# Patient Record
Sex: Female | Born: 1975 | Race: White | Hispanic: No | Marital: Married
Health system: Southern US, Community
[De-identification: ages and names within clinical notes are randomized; demographics above are authoritative.]

---

## 2020-04-26 ENCOUNTER — Encounter (HOSPITAL_COMMUNITY): Payer: Self-pay

## 2020-04-26 ENCOUNTER — Emergency Department (HOSPITAL_COMMUNITY): Payer: No Typology Code available for payment source

## 2020-04-26 ENCOUNTER — Emergency Department (HOSPITAL_COMMUNITY)
Admission: EM | Admit: 2020-04-26 | Discharge: 2020-04-26 | Disposition: A | Discharge status: Home / Self Care | Payer: No Typology Code available for payment source | Attending: Emergency Medicine | Admitting: Emergency Medicine

## 2020-04-26 DIAGNOSIS — R109 Unspecified abdominal pain: Secondary | ICD-10-CM | POA: Diagnosis present

## 2020-04-26 DIAGNOSIS — N23 Unspecified renal colic: Secondary | ICD-10-CM | POA: Diagnosis not present

## 2020-04-26 LAB — CBC
HCT: 38.6 % (ref 36.0–46.0)
Hemoglobin: 12.8 g/dL (ref 12.0–15.0)
MCH: 32.6 pg (ref 26.0–34.0)
MCHC: 33.2 g/dL (ref 30.0–36.0)
MCV: 98.2 fL (ref 80.0–100.0)
Platelets: 114 10*3/uL — ABNORMAL LOW (ref 150–400)
RBC: 3.93 MIL/uL (ref 3.87–5.11)
RDW: 13.2 % (ref 11.5–15.5)
WBC: 7.9 10*3/uL (ref 4.0–10.5)
nRBC: 0 % (ref 0.0–0.2)

## 2020-04-26 LAB — URINALYSIS, ROUTINE W REFLEX MICROSCOPIC
Bilirubin Urine: NEGATIVE
Glucose, UA: NEGATIVE mg/dL
Ketones, ur: NEGATIVE mg/dL
Leukocytes,Ua: NEGATIVE
Nitrite: NEGATIVE
Protein, ur: 30 mg/dL — AB
Specific Gravity, Urine: 1.02 (ref 1.005–1.030)
pH: 6 (ref 5.0–8.0)

## 2020-04-26 LAB — BASIC METABOLIC PANEL
Anion gap: 9 (ref 5–15)
BUN: 17 mg/dL (ref 6–20)
CO2: 22 mmol/L (ref 22–32)
Calcium: 8.5 mg/dL — ABNORMAL LOW (ref 8.9–10.3)
Chloride: 114 mmol/L — ABNORMAL HIGH (ref 98–111)
Creatinine, Ser: 0.99 mg/dL (ref 0.44–1.00)
GFR, Estimated: >60 mL/min (ref >60)
Glucose, Bld: 151 mg/dL — ABNORMAL HIGH (ref 70–99)
Potassium: 4.3 mmol/L (ref 3.5–5.1)
Sodium: 145 mmol/L (ref 135–145)

## 2020-04-26 MED ORDER — FENTANYL CITRATE (PF) 100 MCG/2ML IJ SOLN
50.0000 ug | Freq: Once | INTRAMUSCULAR | Status: AC
  Administered 2020-04-26: 10:00:00 50 ug via INTRAVENOUS
  Filled 2020-04-26: qty 2

## 2020-04-26 MED ORDER — OXYCODONE-ACETAMINOPHEN 5-325 MG PO TABS
1.0000 | ORAL_TABLET | ORAL | Status: DC | PRN
  Administered 2020-04-26: 1 via ORAL
  Filled 2020-04-26: qty 1

## 2020-04-26 MED ORDER — HYDROMORPHONE HCL 1 MG/ML IJ SOLN
1.0000 mg | Freq: Once | INTRAMUSCULAR | Status: AC
  Administered 2020-04-26: 07:00:00 1 mg via INTRAVENOUS
  Filled 2020-04-26: qty 1

## 2020-04-26 MED ORDER — KETOROLAC TROMETHAMINE 30 MG/ML IJ SOLN
30.0000 mg | Freq: Once | INTRAMUSCULAR | Status: AC
Start: 2020-04-26 — End: 2020-04-26
  Administered 2020-04-26: 10:00:00 30 mg via INTRAVENOUS
  Filled 2020-04-26: qty 1

## 2020-04-26 MED ORDER — ONDANSETRON 4 MG PO TBDP: 4.0000 mg | ORAL_TABLET | Freq: Three times a day (TID) | ORAL | 0 refills | Status: AC | PRN

## 2020-04-26 MED ORDER — OXYCODONE HCL 5 MG PO TABS
2.5000 mg | ORAL_TABLET | Freq: Four times a day (QID) | ORAL | 0 refills | Status: AC | PRN
Start: 2020-04-26 — End: ?

## 2020-04-26 MED ORDER — KETOROLAC TROMETHAMINE 10 MG PO TABS: 10.0000 mg | ORAL_TABLET | Freq: Four times a day (QID) | ORAL | 0 refills | Status: AC | PRN

## 2020-04-26 MED ORDER — TAMSULOSIN HCL 0.4 MG PO CAPS: 0.4000 mg | ORAL_CAPSULE | Freq: Two times a day (BID) | ORAL | 0 refills | Status: AC

## 2020-04-26 MED ORDER — ONDANSETRON 4 MG PO TBDP
4.0000 mg | ORAL_TABLET | Freq: Once | ORAL | Status: AC
  Administered 2020-04-26: 06:00:00 4 mg via ORAL
  Filled 2020-04-26: qty 1

## 2020-04-26 NOTE — ED Provider Notes (Signed)
Holly Krause   CSN: [id number] Arrival date & time: 04/26/20  0535     History No chief complaint on file.   Holly Krause is a 44 y.o. female who is visiting family from out of town.  She presents with a chief complaint of flank pain.  She has a longstanding history of kidney stones and multiple surgeries for large kidney stones.  She was in [place].  She also has a past medical history of cutaneous T-cell lymphoma.  Patient states that she woke to use the restroom around 3 AM just after laying back down she began having right-sided flank pain.  She has known kidney stones on the side and states that her pain is exactly the same as previous kidney stones.  She has severe pain in the right flank radiating to her groin.  She had severe nausea without vomiting, pain is colicky, comes in waves of severity, sharp.  Nothing seems to make it worse or better.  She took a Percocet that she had leftover from a previous kidney stone with only minimal improvement.  The pain did not pass so she came to the emergency department.  She denies fevers, chills, urinary symptoms, vaginal symptoms, diarrhea, cough, shortness of breath.  HPI     History reviewed. No pertinent past medical history.  There are no problems to display for this patient.  The histories are not reviewed yet. Please review them in the "History" navigator section and refresh this SmartLink.   OB History   No obstetric history on file.     No family history on file.     Home Medications Prior to Admission medications   Not on File    Allergies    Patient has no allergy information on record.  Review of Systems   Review of Systems Ten systems reviewed and are negative for acute change, except as noted in the HPI.   Physical Exam Updated Vital Signs BP (!) 133/95   Pulse 80   Temp 97.9 F (36.6 C) (Oral)   Resp 20   SpO2 97%   Physical Exam Vitals  and nursing Krause reviewed.  Constitutional:      General: She is not in acute distress.    Appearance: She is well-developed and well-nourished. She is not diaphoretic.     Comments:   Patient appears uncomfortable.  She is moaning and grabbing her right side  HENT:     Head: Normocephalic and atraumatic.  Eyes:     General: No scleral icterus.    Conjunctiva/sclera: Conjunctivae normal.  Cardiovascular:     Rate and Rhythm: Normal rate and regular rhythm.     Heart sounds: Normal heart sounds. No murmur heard. No friction rub. No gallop.   Pulmonary:     Effort: Pulmonary effort is normal. No respiratory distress.     Breath sounds: Normal breath sounds.  Abdominal:     General: Bowel sounds are normal. There is no distension.     Palpations: Abdomen is soft. There is no mass.     Tenderness: There is no abdominal tenderness. There is no right CVA tenderness, left CVA tenderness or guarding.  Musculoskeletal:     Cervical back: Normal range of motion.  Skin:    General: Skin is warm and dry.  Neurological:     Mental Status: She is alert and oriented to person, place, and time.  Psychiatric:  Behavior: Behavior normal.     ED Results / Procedures / Treatments   Labs (all labs ordered are listed, but only abnormal results are displayed) Labs Reviewed  BASIC METABOLIC PANEL  CBC  URINALYSIS, ROUTINE W REFLEX MICROSCOPIC  I-STAT BETA HCG BLOOD, ED ([facility], [facility], [facility] ONLY)    EKG None  Radiology No results found.  Procedures Procedures (including critical care time)  Medications Ordered in ED Medications  oxyCODONE-acetaminophen (PERCOCET/ROXICET) 5-325 MG per tablet 1 tablet (1 tablet Oral Given 04/26/20 0618)  ondansetron (ZOFRAN-ODT) disintegrating tablet 4 mg (4 mg Oral Given 04/26/20 0618)  HYDROmorphone (DILAUDID) injection 1 mg (1 mg Intravenous Given 04/26/20 2947)    ED Course  I have reviewed the triage vital signs and the nursing notes.  Pertinent  labs & imaging results that were available during my care of the patient were reviewed by me and considered in my medical decision making (see chart for details).  Clinical Course as of 05/02/20 1029  Tue Apr 26, 2020  0902 Patient here with right flank pain and history of multiple previous kidney stones.  I reviewed the CT scan and images with a distal 6 mm stone with obstructive uropathy and possible forniceal rupture of the right kidney.  I placed a call to urology for guidance on image results. [AH]  [id number] Case discussed with Dr. Urban Gibson.  He reviewed the images and lab findings with me.  Patient's findings do not represent any emergent issue needing surgical intervention.  We will wait for urinalysis to rule out any signs or symptoms of infected stone.  He feels that she can still trial outpatient management with pain meds, Flomax. [AH]    Clinical Course User Index [AH] Arthor Captain, PA-C   MDM Rules/Calculators/A&P                          Given the large differential diagnosis for [facility], the decision making in this case is of high complexity. I have reviewed the patient's labs which show  1) UA w/o infection 2) bmp w/ normal creatinine and mildly elevated  Blood glucose 3) cbc wnl I personally reviewed the patient's images which show Distal R ureteral stone with  Possible forniceal rupture.  Patient given multiple rounds of pain medication and is currently comfortable.  The presentation NOT consistent with an infected stone, nephric abscess, sepsis, or renal failure.  Similarly, this presentation is NOT consistent with AAA; Mesenteric Ischemia; Bowel Perforation; Bowel Obstruction; Sigmoid Volvulus; Diverticulitis; Appendicitis; Peritonitis; Cholecystitis, ascending cholangitis or other gallbladder disease; perforated ulcer; significant GI bleeding, splenic rupture/infarction; Hepatic abscess; or other surgical/acute abdomen.  Similarly, this presentation is NOT consistent  with ACS or Myocardial Ischemia; Pulmonary Embolism; fistula; incarcerated hernia; Pancreatitis, Aortic Dissection; Diabetic Ketoacidosis; Ischemic colitis; Psoas or other abscess; Methanol poisoning; Heavy metal toxicity; or porphyria.  Similarly, this case is NOT consistent with Fitz-Hugh-Curtis Syndrome, Ectopic Pregnancy, Placental Abruption, PID, Tubo-ovarian abscess, Ovarian Torsion, or STI.  Similarly, this presentation is NOT consistent with acute coronary syndrome, pulmonary embolism, dissection, borhaave's, arrythmia, pneumothorax, cardiac tamponade, or other emergent cardiopulmonary condition.  Similarly, this presentation is NOT consistent with pyelonephritis, urinary infection, pneumonia, or other focal bacterial infection.  Strict return and follow-up precautions have been given by me personally or by detailed written instruction verbalized by nursing staff using the teach back method. to the patient/family/caregiver(s).  Data Reviewed/Counseling: I have reviwed the patient's vital signs, nursing notes, and other relevant tests/information. I  had a detailed discussion regarding the historical points, exam findings, and any diagnostic results supporting the discharge diagnosis. I also discussed the need for outpatient follow-up and the need to return to the ED if symptoms worsen or if there are any questions or concerns that arise at home.  Final Clinical Impression(s) / ED Diagnoses Final diagnoses:  Ureteral colic    Rx / DC Orders ED Discharge Orders    None       Arthor Captain, PA-C 05/02/20 1032    Pricilla Loveless, MD 05/02/20 1501

## 2020-04-26 NOTE — ED Notes (Signed)
Pt ambulatory to bathroom, no assistance needed.  

## 2020-04-26 NOTE — ED Triage Notes (Signed)
Pt complains of right flank pain with a know kidney stone that acutely started hurting this am at 2am

## 2020-04-26 NOTE — ED Notes (Signed)
Discharge paperwork reviewed with pt, including prescriptions.  Pt verbalized understanding, no questions or concerns at time of discharge, ambulatory to ED exit.

## 2020-04-26 NOTE — ED Notes (Signed)
Pt aware urine sample needed 

## 2020-04-26 NOTE — Discharge Instructions (Signed)
Return to the ED immediately if you develop fever, uncontrolled pain or vomiting, or other concerns. ° °

## 2022-07-19 IMAGING — CT CT RENAL STONE PROTOCOL
2 of 4 series · 16 of 46 positions shown, 18 images · non-contrast
Comparison: None.

CLINICAL DATA: 44-year-old female with right flank pain since 3933
hours. History of prior kidney stones.

EXAM:
CT ABDOMEN AND PELVIS WITHOUT CONTRAST
TECHNIQUE: Multidetector CT imaging of the abdomen and pelvis was performed
following the standard protocol without IV contrast.

[Series 2: axial st · axial · 0.87mm/px · z∈[+1119,+1549]mm · 13 of 96 slices shown, 15 images]
[im 5/96  soft-tissue]
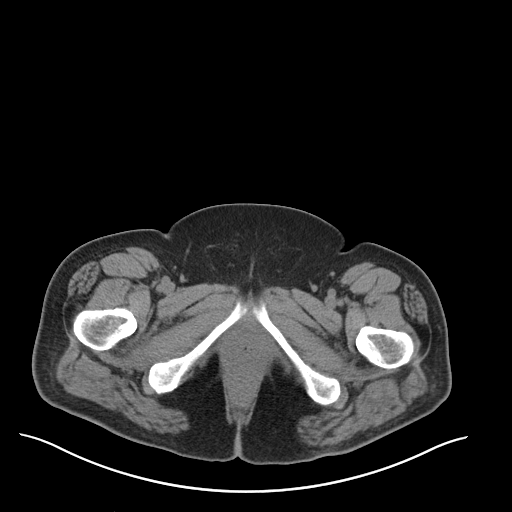
[im 5/96  bone]
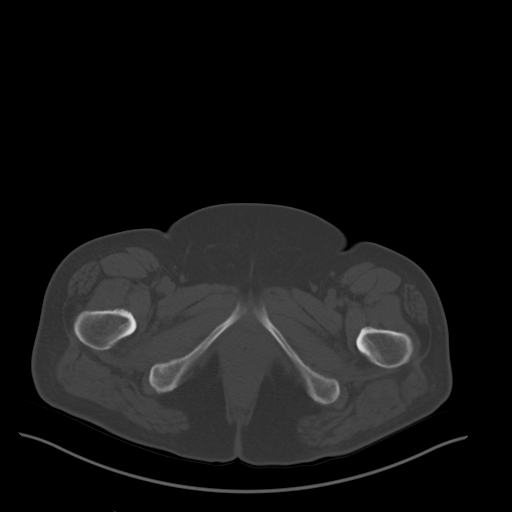
[im 14/96  soft-tissue]
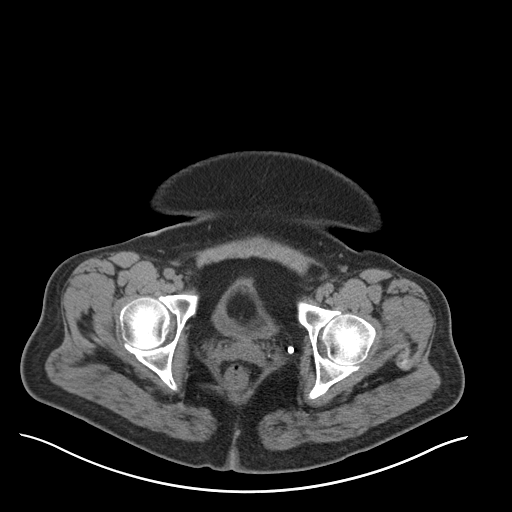
[im 19/96  soft-tissue]
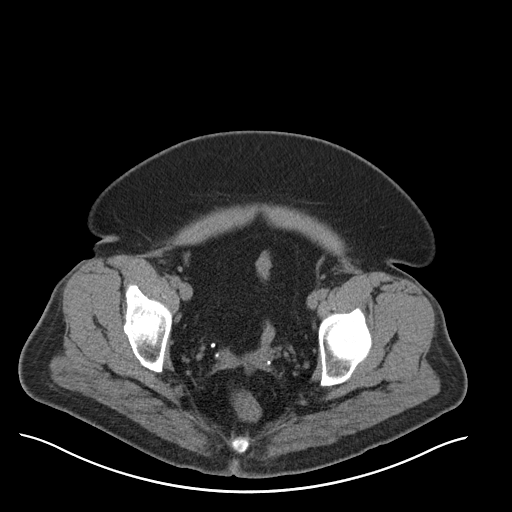
[im 28/96  soft-tissue]
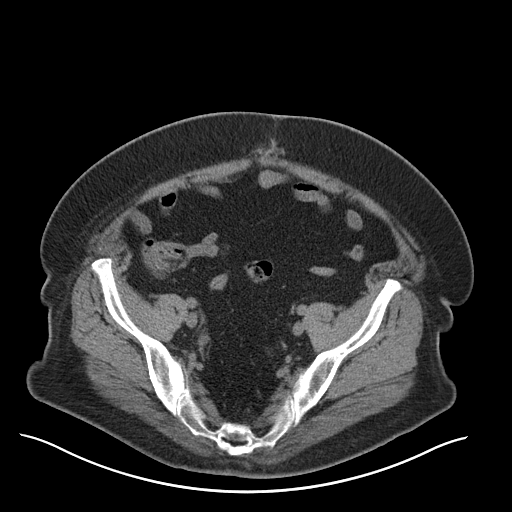
[im 32/96  soft-tissue]
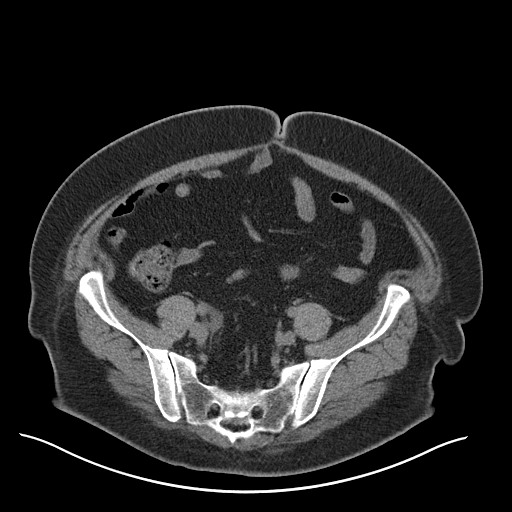
[im 41/96  soft-tissue]
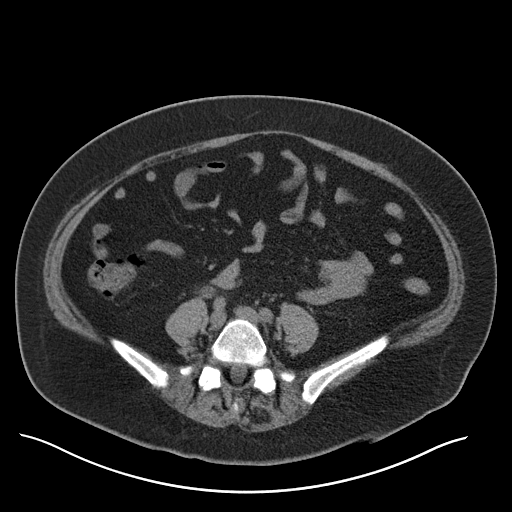
[im 50/96  soft-tissue]
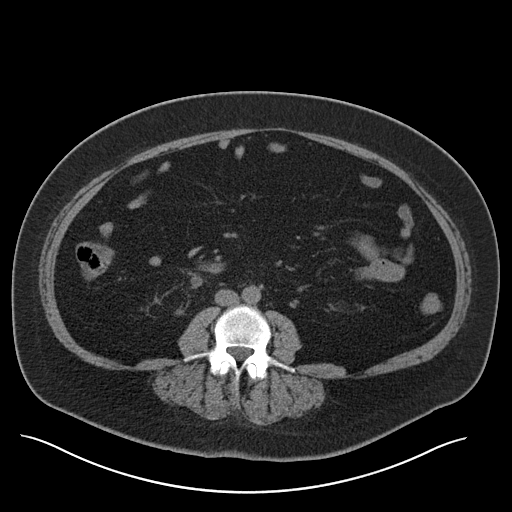
[im 55/96  soft-tissue]
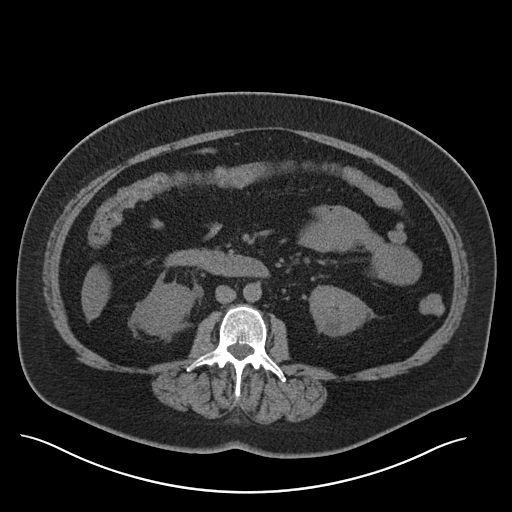
[im 64/96  soft-tissue]
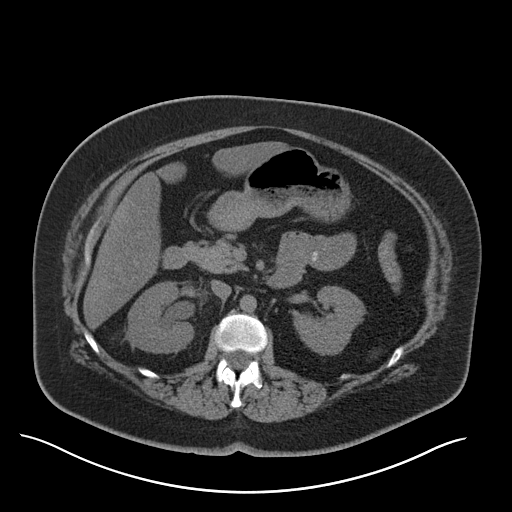
[im 64/96  bone]
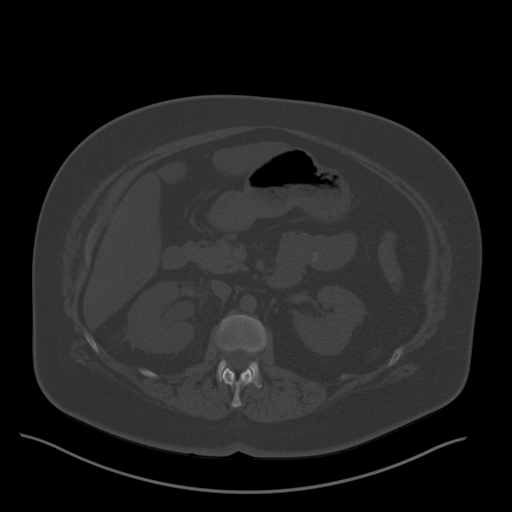
[im 68/96  soft-tissue]
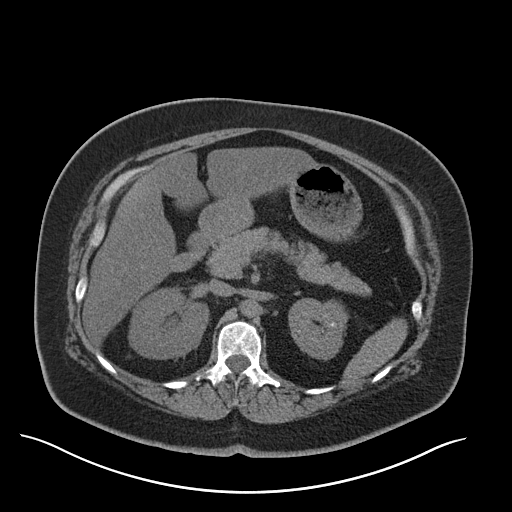
[im 77/96  soft-tissue]
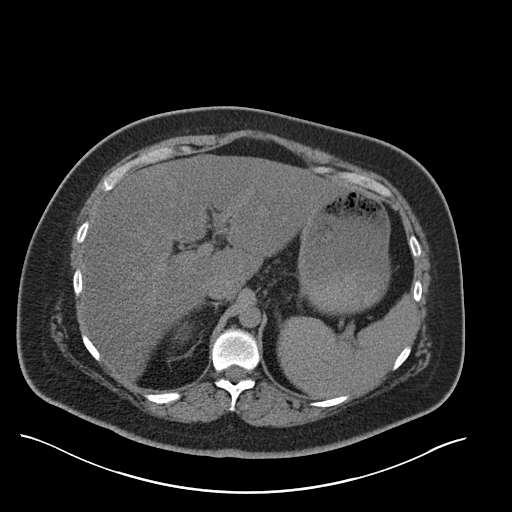
[im 82/96  soft-tissue]
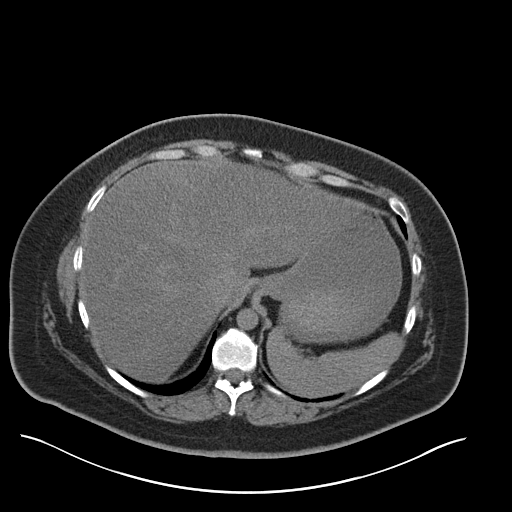
[im 91/96  soft-tissue]
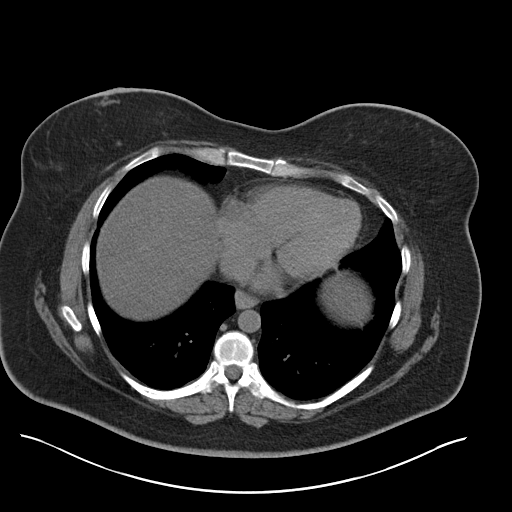

[Series 4: coronal · coronal · 0.80mm/px · 3 of 161 slices shown]
[im 54/161  soft-tissue]
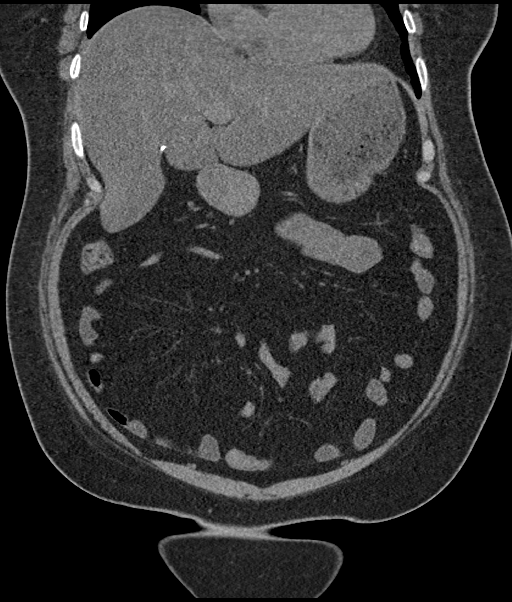
[im 72/161  soft-tissue]
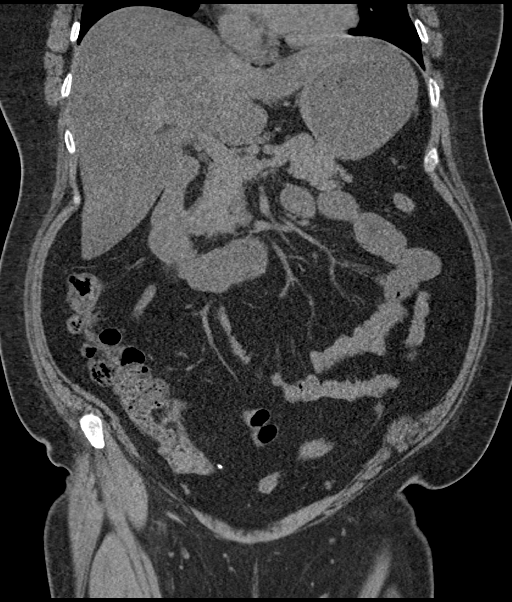
[im 89/161  soft-tissue]
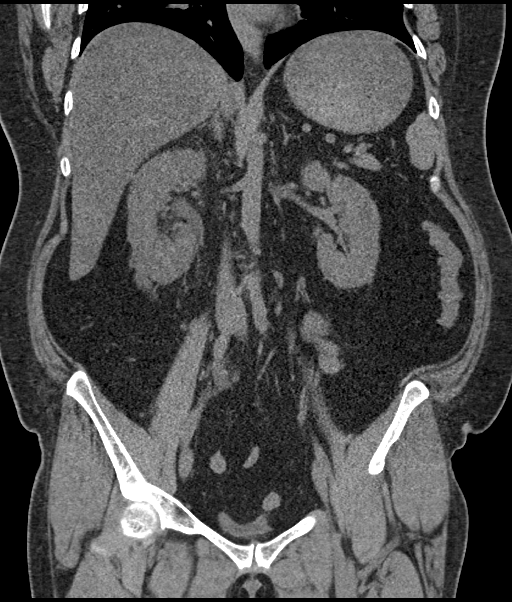

[16 of 46 positions shown; findings below may reference images not displayed]

FINDINGS: Lower chest: Minimal costophrenic angle scarring or atelectasis.
Otherwise negative.

Hepatobiliary: Hepatic steatosis with mild heterogeneous sparing in
the right lobe. Absent gallbladder.

Pancreas: Negative.

Spleen: Negative.

Adrenals/Urinary Tract: Normal adrenal glands.

Extensive left nephrolithiasis. Left renal calculi range from
punctate to 7 mm (coronal image 102). No left hydronephrosis. Normal
left ureter.

Completely decompressed urinary bladder.

Moderate right hydronephrosis with nephromegaly and moderate
perinephric stranding. Forniceal rupture is possible. Right
hydroureter and periureteral stranding continues into the pelvis
where a distal calculus measuring up to 6 mm (coronal image 109) is
located about 4 cm proximal to the ureterovesical junction.

Superimposed bilateral pelvic phleboliths.

No residual right intrarenal calculus.

Stomach/Bowel: Decompressed and negative large bowel. Surgically
absent appendix (series 2, image 76). Negative terminal ileum. No
dilated small bowel. Unremarkable stomach and duodenum. No free air
or free fluid. Mild postoperative changes to the ventral abdominal
wall at the umbilicus.

Vascular/Lymphatic: Normal caliber abdominal aorta. No calcified
atherosclerosis identified. Vascular patency is not evaluated in the
absence of IV contrast. No lymphadenopathy.

Reproductive: Surgically absent uterus. Diminutive or absent
ovaries.

Other: No pelvic free fluid.

Musculoskeletal: Lower lumbar facet degeneration with vacuum facet.
No acute osseous abnormality identified.
IMPRESSION: 1. Acute obstructive uropathy on the right with a 6 mm distal
ureteral calculus located about 4 cm proximal to the right UVJ.
Possible forniceal rupture.
2. Extensive left nephrolithiasis, up to 7 mm.
3. Hepatic steatosis.
4. Prior appendectomy, cholecystectomy, hysterectomy.
